# Patient Record
Sex: Male | Born: 1970 | Race: Black or African American | Hispanic: No | Marital: Married | Smoking: Never smoker
Health system: Southern US, Community
[De-identification: ages and names within clinical notes are randomized; demographics above are authoritative.]

---

## 2016-08-07 ENCOUNTER — Encounter (HOSPITAL_COMMUNITY): Payer: Self-pay | Admitting: *Deleted

## 2016-08-07 ENCOUNTER — Emergency Department (HOSPITAL_COMMUNITY): Payer: Self-pay

## 2016-08-07 ENCOUNTER — Emergency Department (HOSPITAL_COMMUNITY)
Admission: EM | Admit: 2016-08-07 | Discharge: 2016-08-07 | Disposition: A | Discharge status: Home / Self Care | Payer: Self-pay | Attending: Emergency Medicine | Admitting: Emergency Medicine

## 2016-08-07 DIAGNOSIS — M898X1 Other specified disorders of bone, shoulder: Secondary | ICD-10-CM | POA: Insufficient documentation

## 2016-08-07 MED ORDER — CYCLOBENZAPRINE HCL 10 MG PO TABS: 10.0000 mg | ORAL_TABLET | Freq: Two times a day (BID) | ORAL | 0 refills | Status: AC | PRN

## 2016-08-07 MED ORDER — IBUPROFEN 800 MG PO TABS: 800.0000 mg | ORAL_TABLET | Freq: Three times a day (TID) | ORAL | 0 refills | Status: AC

## 2016-08-07 NOTE — ED Notes (Signed)
Patient transported to X-ray 

## 2016-08-07 NOTE — ED Provider Notes (Signed)
[facility]-EMERGENCY DEPT Provider Note   CSN: [id number] Arrival date & time: 08/07/16  [id number]     History   Chief Complaint Chief Complaint  Patient presents with  . Shoulder Injury    HPI Jeremiah Cowan is a 46 y.o. male.  HPI   46 year old male presenting complaining of right shoulder pain. Patient report pain to the right posterior upper back ongoing for the past week. Pain resides underneath his right shoulder blade. Described as a sharp sensation, worsening with movement of arm or when he lays on the affected side. Pain is moderate in intensity. It does not hurt to breathe. He denies any associated fever, chills, URI symptoms, chest pain, shortness of breath, productive cough, hemoptysis, abdominal pain, focal numbness or weakness, or rash. Patient recall sleeping on a mattress at his mom's house last weekend prior to the start of the pain. He also felt that he has that mattress at home and think it may contribute to his pain. He normally sleeps on his side. He denies any history of PE or DVT, no recent surgery, prolonged bed rest, unilateral leg swelling or calf pain, or active cancer. No significant cardiac history. He is left-hand dominant. He denies any specific treatment tried for his symptoms. Denies any strenuous activities or recent injury.  History reviewed. No pertinent past medical history.  There are no active problems to display for this patient.   History reviewed. No pertinent surgical history.     Home Medications    Prior to Admission medications   Not on File    Family History No family history on file.  Social History Social History  Substance Use Topics  . Smoking status: Never Smoker  . Smokeless tobacco: Never Used  . Alcohol use No     Allergies   Patient has no known allergies.   Review of Systems Review of Systems  All other systems reviewed and are negative.    Physical Exam Updated Vital Signs BP 139/86   Pulse 77   Temp 97.7 F  (36.5 C) (Oral)   Resp 18   Ht 6\' 4"  (1.93 m)   Wt (!) 141.1 kg (311 lb)   SpO2 95%   BMI 37.86 kg/m   Physical Exam  Constitutional: He appears well-developed and well-nourished. No distress.  HENT:  Head: Atraumatic.  Eyes: Conjunctivae are normal.  Neck: Neck supple.  Cardiovascular: Normal rate and regular rhythm.   Pulmonary/Chest: Effort normal and breath sounds normal.  Normal appearing surgical scar noted to right posterior back  Abdominal: Soft. Bowel sounds are normal.  Musculoskeletal: He exhibits tenderness (Right scapular region. Tenderness noted to the inferior aspects of the scapula region on palpation without any overlying skin changes. Normal range of right shoulder. No winged scapula. No rash).  Neurological: He is alert.  Skin: No rash noted.  Psychiatric: He has a normal mood and affect.  Nursing note and vitals reviewed.    ED Treatments / Results  Labs (all labs ordered are listed, but only abnormal results are displayed) Labs Reviewed - No data to display  EKG  EKG Interpretation None       Radiology Dg Chest 2 View  Result Date: 08/07/2016 CLINICAL DATA:  Intermittent right scapular pain 1 week worse with movement. Uncertain of injury. Thoracentesis x2 2010. EXAM: CHEST  2 VIEW COMPARISON:  None. FINDINGS: Lungs are adequately inflated with blunting of the right costophrenic angle likely chronic due to pleuroparenchymal scarring. Flattening of the hemidiaphragms on the lateral  film. No focal airspace consolidation or pneumothorax. Cardiomediastinal silhouette is within normal. Minimal degenerate change of the spine. IMPRESSION: No acute cardiopulmonary disease. 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 Electronically Signed   By: Elberta Fortis M.D.   On: 08/07/2016 07:38    Procedures Procedures (including critical care time)  Medications Ordered in ED Medications - No data to  display   Initial Impression / Assessment and Plan / ED Course  I have reviewed the triage vital signs and the nursing notes.  Pertinent labs & imaging results that were available during my care of the patient were reviewed by me and considered in my medical decision making (see chart for details).    BP 137/87   Pulse 80   Temp 97.7 F (36.5 C) (Oral)   Resp 18   Ht 6\' 4"  (1.93 m)   Wt (!) 141.1 kg (311 lb)   SpO2 99%   BMI 37.86 kg/m    Final Clinical Impressions(s) / ED Diagnoses   Final diagnoses:  Pain of right scapula    New Prescriptions New Prescriptions   CYCLOBENZAPRINE (FLEXERIL) 10 MG TABLET    Take 1 tablet (10 mg total) by mouth 2 (two) times daily as needed for muscle spasms.   IBUPROFEN (ADVIL,MOTRIN) 800 MG TABLET    Take 1 tablet (800 mg total) by mouth 3 (three) times daily.   7:14 AM Patient here with reproducible pain to his right scapular region worsening with movement. There is no restriction of movement of the right shoulder arm. No signs of infection overlying the skin. His lungs are clear to auscultation. No radiculopathy down his right arm. He is PERC negative, low suspicion for PE. Doubt ACS. Will obtain a screening chest x-ray. Suspect MSK pain.  7:42 AM A screening chest x-ray was performed showing no acute cardiopulmonary disease. 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 Otherwise no other concerning feature. Patient will be treated with NSAIDs and muscle relaxant. Return precaution discussed. He is stable for discharge.   Fayrene Helper, PA-C 08/07/16 1610    Azalia Bilis, MD 08/07/16 [phone]

## 2016-08-07 NOTE — ED Triage Notes (Signed)
The pt is c/o rt scapula pain for one week  He does not know if he has injured it  He does not think that he has

## 2018-10-29 IMAGING — CR DG CHEST 2V
2 series · 2 of 2 positions shown · non-contrast
Comparison: None.

CLINICAL DATA: Intermittent right scapular pain 1 week worse with
movement. Uncertain of injury. Thoracentesis x2 4636.

EXAM:
CHEST  2 VIEW

[chest pa]
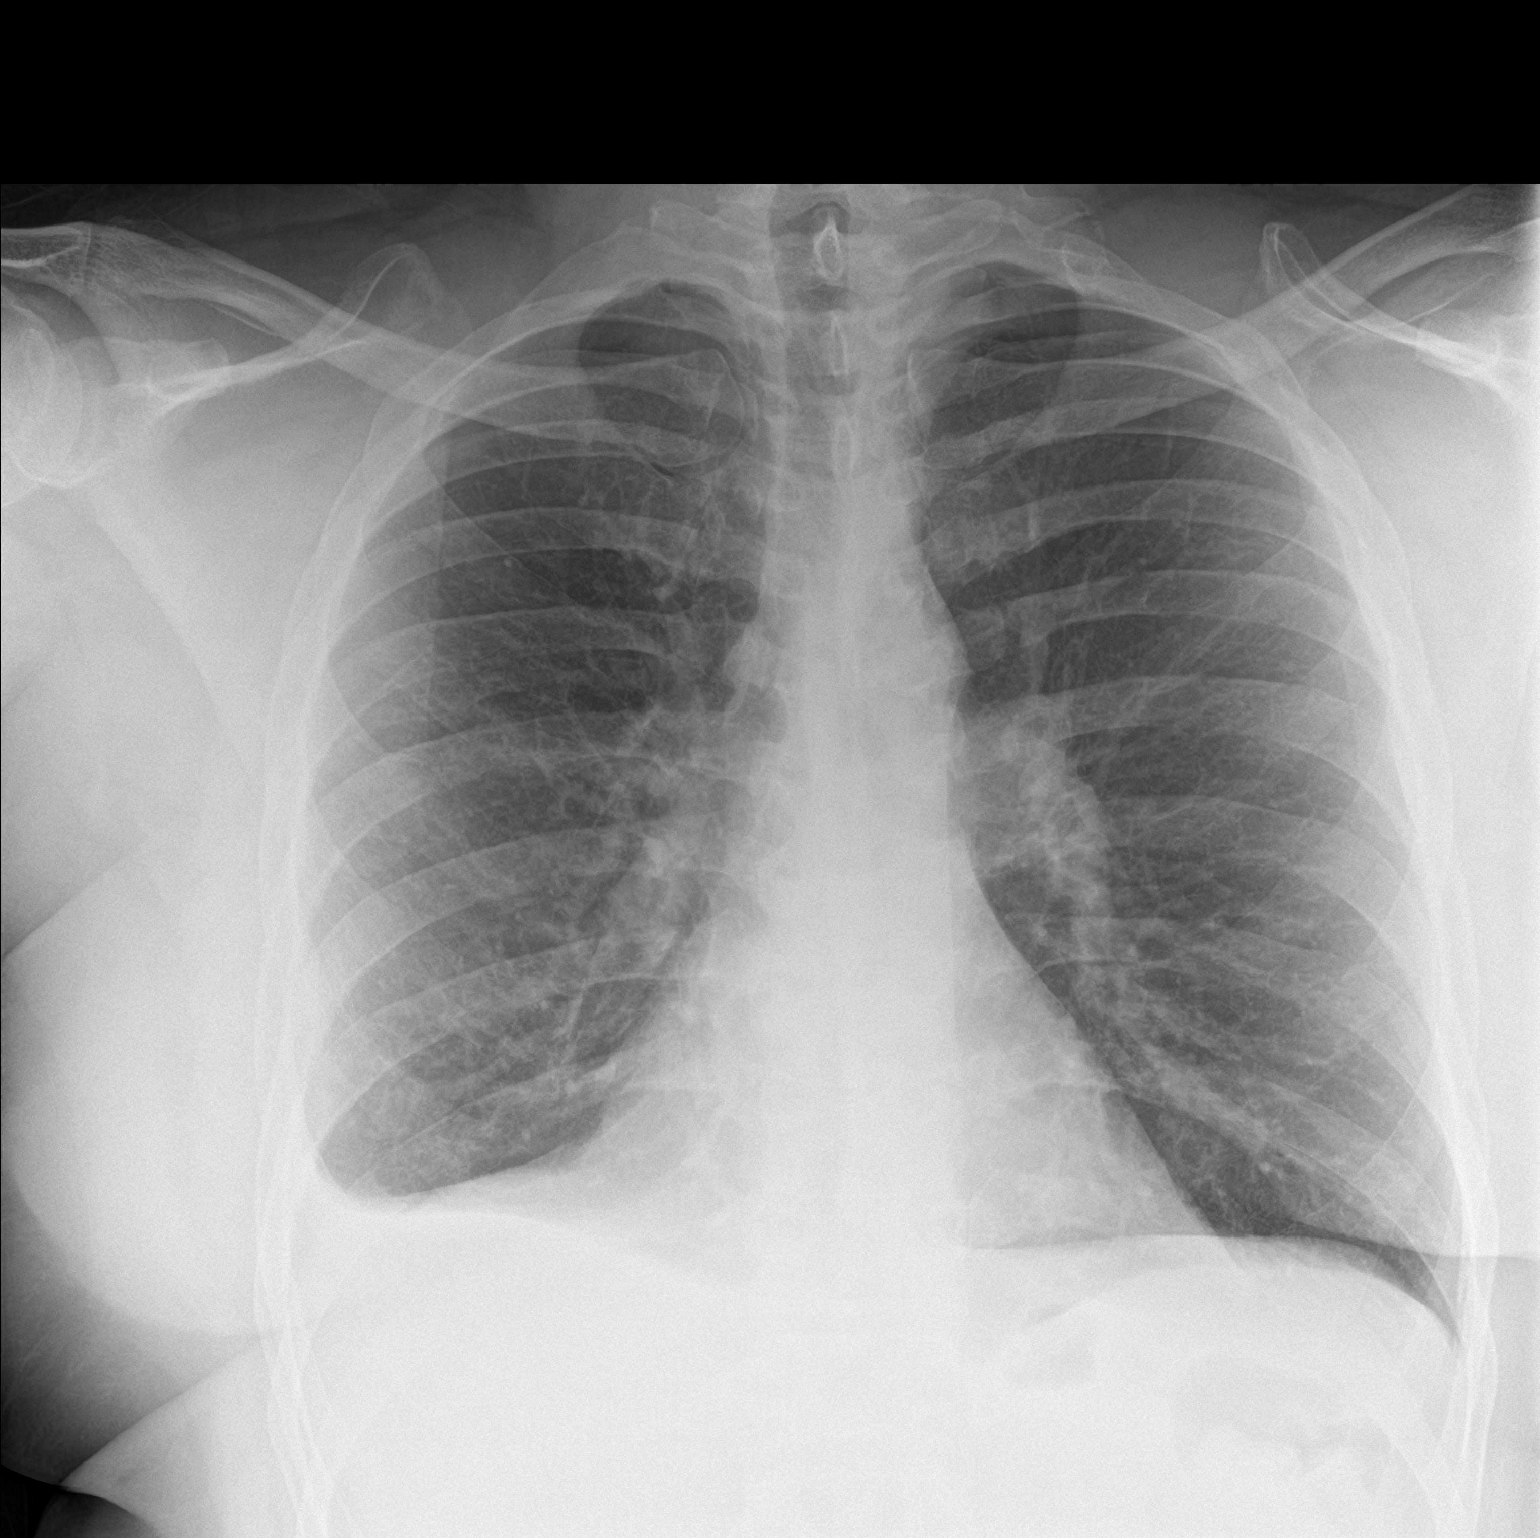

[chest lat]
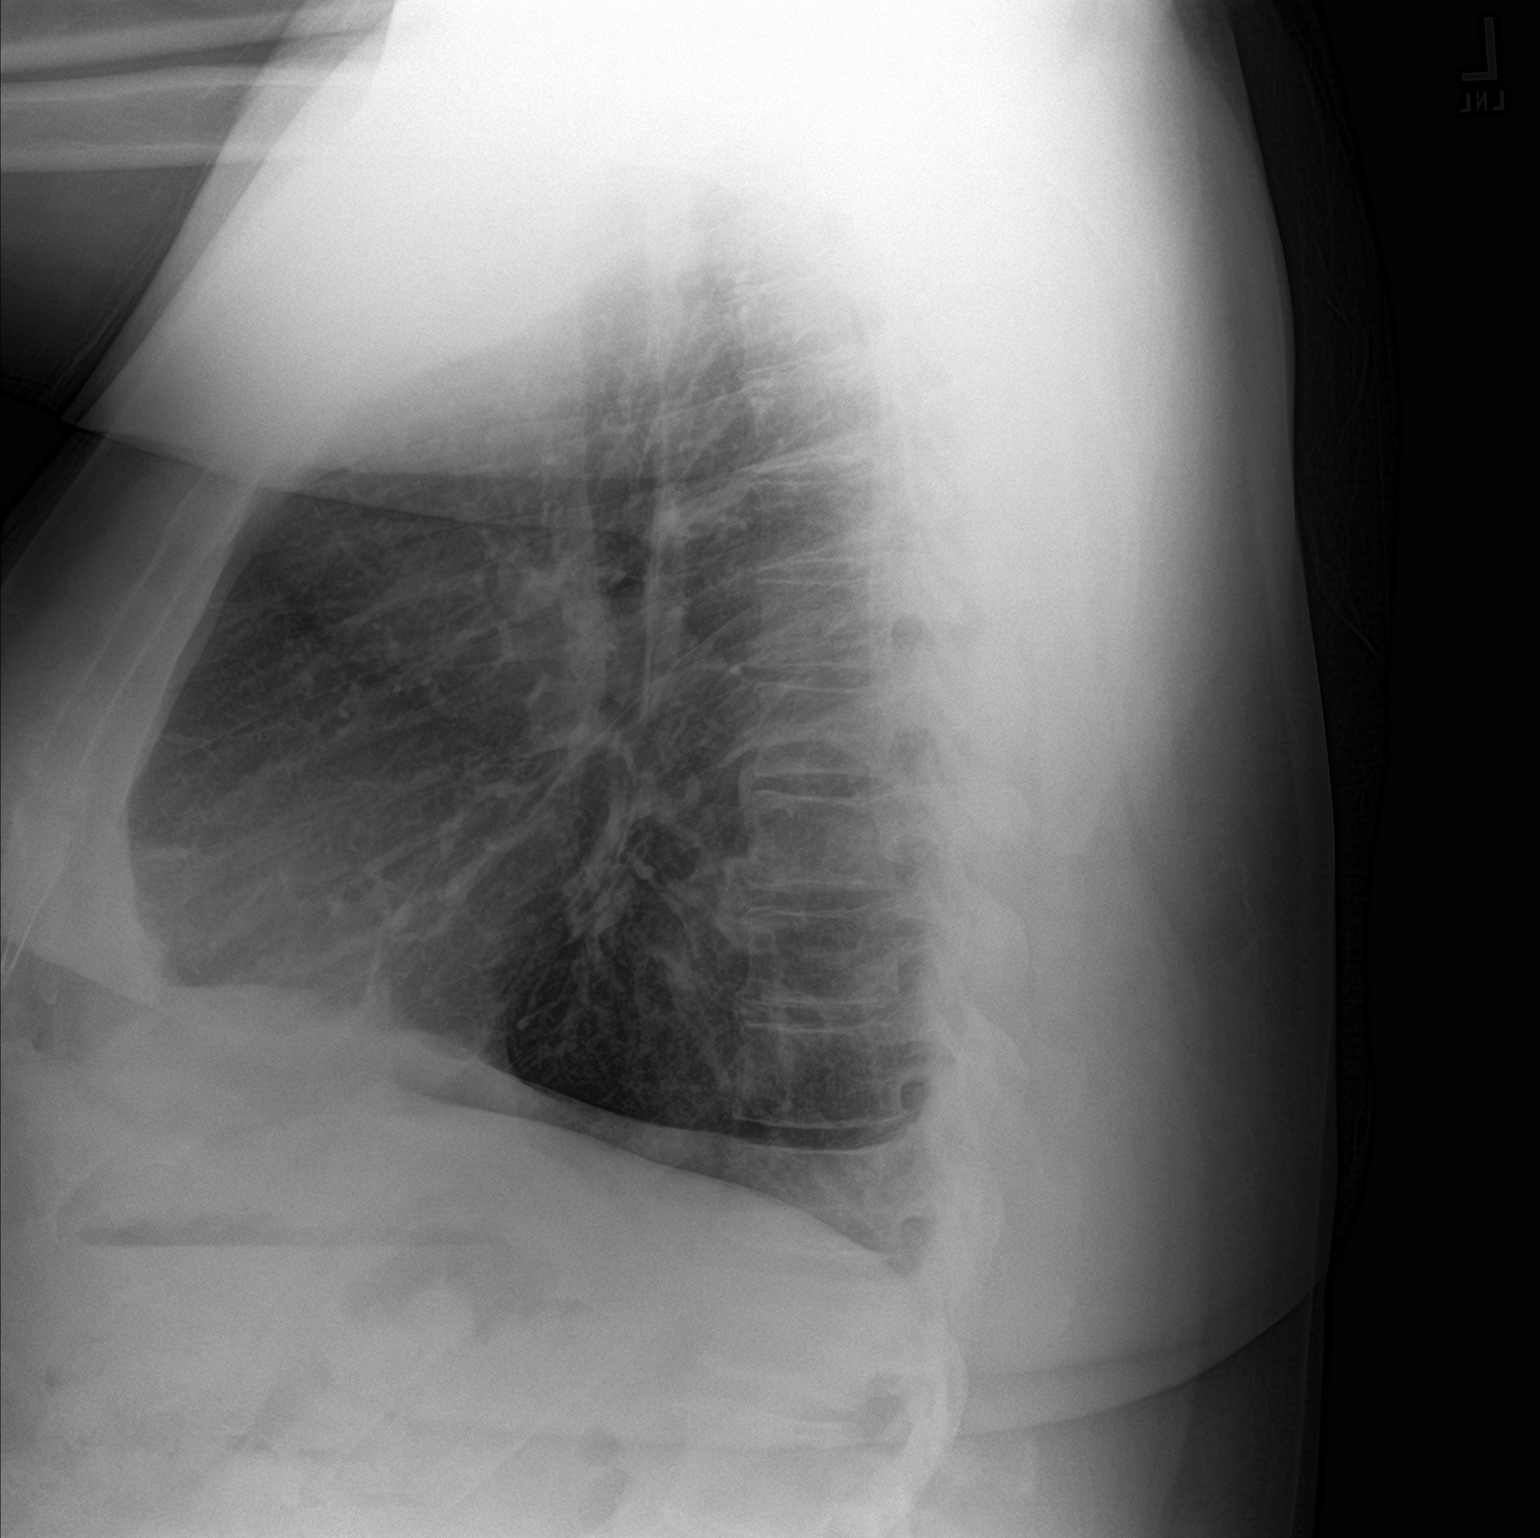

[2 of 2 positions shown; findings below may reference images not displayed]

FINDINGS: Lungs are adequately inflated with blunting of the right
costophrenic angle likely chronic due to pleuroparenchymal scarring.
Flattening of the hemidiaphragms on the lateral film. No focal
airspace consolidation or pneumothorax. Cardiomediastinal silhouette
is within normal. Minimal degenerate change of the spine.
IMPRESSION: No acute cardiopulmonary disease.

Blunting of the right costophrenic angle likely due to chronic
pleuroparenchymal scarring.

## 2022-06-27 ENCOUNTER — Ambulatory Visit
Admission: RE | Admit: 2022-06-27 | Discharge: 2022-06-27 | Disposition: A | Discharge status: Home / Self Care | Payer: Medicaid Other | Source: Ambulatory Visit | Attending: Family Medicine | Admitting: Family Medicine

## 2022-06-27 ENCOUNTER — Other Ambulatory Visit: Payer: Self-pay

## 2022-06-27 DIAGNOSIS — M25561 Pain in right knee: Secondary | ICD-10-CM

## 2022-09-21 ENCOUNTER — Encounter: Payer: Self-pay | Admitting: Physician Assistant
